# Patient Record
Sex: Male | Born: 1955 | Race: Black or African American | Hispanic: No | Smoking: Never smoker
Health system: Southern US, Community
[De-identification: ages and names within clinical notes are randomized; demographics above are authoritative.]

---

## 2015-04-08 ENCOUNTER — Emergency Department (HOSPITAL_BASED_OUTPATIENT_CLINIC_OR_DEPARTMENT_OTHER): Payer: Self-pay

## 2015-04-08 ENCOUNTER — Emergency Department (HOSPITAL_BASED_OUTPATIENT_CLINIC_OR_DEPARTMENT_OTHER)
Admission: EM | Admit: 2015-04-08 | Discharge: 2015-04-09 | Disposition: A | Discharge status: Home / Self Care | Payer: Self-pay | Attending: Emergency Medicine | Admitting: Emergency Medicine

## 2015-04-08 ENCOUNTER — Encounter (HOSPITAL_BASED_OUTPATIENT_CLINIC_OR_DEPARTMENT_OTHER): Payer: Self-pay | Admitting: *Deleted

## 2015-04-08 DIAGNOSIS — R42 Dizziness and giddiness: Secondary | ICD-10-CM | POA: Insufficient documentation

## 2015-04-08 DIAGNOSIS — R030 Elevated blood-pressure reading, without diagnosis of hypertension: Secondary | ICD-10-CM | POA: Insufficient documentation

## 2015-04-08 DIAGNOSIS — R110 Nausea: Secondary | ICD-10-CM | POA: Insufficient documentation

## 2015-04-08 DIAGNOSIS — R079 Chest pain, unspecified: Secondary | ICD-10-CM | POA: Insufficient documentation

## 2015-04-08 LAB — COMPREHENSIVE METABOLIC PANEL
ALK PHOS: 53 U/L (ref 38–126)
ALT: 26 U/L (ref 17–63)
AST: 25 U/L (ref 15–41)
Albumin: 4.4 g/dL (ref 3.5–5.0)
Anion gap: 7 (ref 5–15)
BUN: 14 mg/dL (ref 6–20)
CALCIUM: 8.8 mg/dL — AB (ref 8.9–10.3)
CHLORIDE: 105 mmol/L (ref 101–111)
CO2: 26 mmol/L (ref 22–32)
Creatinine, Ser: 0.96 mg/dL (ref 0.61–1.24)
GLUCOSE: 113 mg/dL — AB (ref 65–99)
POTASSIUM: 3.4 mmol/L — AB (ref 3.5–5.1)
Sodium: 138 mmol/L (ref 135–145)
Total Bilirubin: 0.4 mg/dL (ref 0.3–1.2)
Total Protein: 7 g/dL (ref 6.5–8.1)

## 2015-04-08 LAB — CBC WITH DIFFERENTIAL/PLATELET
BASOS PCT: 0 % (ref 0–1)
Basophils Absolute: 0 10*3/uL (ref 0.0–0.1)
EOS ABS: 0.1 10*3/uL (ref 0.0–0.7)
Eosinophils Relative: 2 % (ref 0–5)
HCT: 41.4 % (ref 39.0–52.0)
Hemoglobin: 13.9 g/dL (ref 13.0–17.0)
LYMPHS PCT: 34 % (ref 12–46)
Lymphs Abs: 2.5 10*3/uL (ref 0.7–4.0)
MCH: 29.9 pg (ref 26.0–34.0)
MCHC: 33.6 g/dL (ref 30.0–36.0)
MCV: 89 fL (ref 78.0–100.0)
MONO ABS: 0.6 10*3/uL (ref 0.1–1.0)
MONOS PCT: 9 % (ref 3–12)
Neutro Abs: 4 10*3/uL (ref 1.7–7.7)
Neutrophils Relative %: 55 % (ref 43–77)
PLATELETS: 376 10*3/uL (ref 150–400)
RBC: 4.65 MIL/uL (ref 4.22–5.81)
RDW: 13.4 % (ref 11.5–15.5)
WBC: 7.2 10*3/uL (ref 4.0–10.5)

## 2015-04-08 LAB — TROPONIN I

## 2015-04-08 MED ORDER — ASPIRIN 81 MG PO CHEW
324.0000 mg | CHEWABLE_TABLET | Freq: Once | ORAL | Status: AC
  Administered 2015-04-08: 324 mg via ORAL
  Filled 2015-04-08: qty 4

## 2015-04-08 MED ORDER — NITROGLYCERIN 0.4 MG SL SUBL
0.4000 mg | SUBLINGUAL_TABLET | SUBLINGUAL | Status: DC | PRN
  Filled 2015-04-08: qty 1

## 2015-04-08 MED ORDER — GI COCKTAIL ~~LOC~~
30.0000 mL | Freq: Once | ORAL | Status: AC
  Administered 2015-04-08: 30 mL via ORAL
  Filled 2015-04-08: qty 30

## 2015-04-08 NOTE — ED Notes (Signed)
Pt sts he began having a feeling like something is moving around in his chest x2 hours. Pt sts he thought is was gas but it is persisting. Denies any other symptoms.

## 2015-04-08 NOTE — ED Provider Notes (Signed)
CSN: [id number]     Arrival date & time 04/08/15  2112 History  This chart was scribed for Victor Fossa, MD by Lyndel Safe, ED Scribe. This patient was seen in room MH05/MH05 and the patient's care was started 9:29 PM.   Chief Complaint  Patient presents with  . Chest Pain   The history is provided by the patient. No language interpreter was used.    HPI Comments: Victor Bradley is a 59 y.o. male, with no pertinent PMhx, who presents to the Emergency Department complaining of waxing and waning, 3/10 chest pain onset 3 hours ago. He describes the pain to feel like 'gas moving.' Pt reports he was sitting on his front porch when the pain came on. He associates nausea and dizziness. He states he has had a similar pain in the past which he was evaluated for and diagnosed with gas. He reports he consumes liquor and beer daily, having consumed 1 beer pta. Pt is not followed by a PCP and denies a history of cardiac catheterization or stress test. Additionally denies use of smoking tobacco, taking Cialis, or illegal drugs (specifically cocaine) within 24 hours, abdominal pain, diaphoresis, LE edema, recent injuries, or any PMhx.   History reviewed. No pertinent past medical history. History reviewed. No pertinent past surgical history. No family history on file. History  Substance Use Topics  . Smoking status: Never Smoker   . Smokeless tobacco: Not on file  . Alcohol Use: Yes    Review of Systems  Constitutional: Negative for diaphoresis.  Cardiovascular: Positive for chest pain. Negative for leg swelling.  Gastrointestinal: Positive for nausea. Negative for abdominal pain.  Neurological: Positive for dizziness.   Allergies  Review of patient's allergies indicates no known allergies.  Home Medications   Prior to Admission medications   Medication Sig Start Date End Date Taking? Authorizing Provider  Multiple Vitamin (MULTIVITAMIN) tablet Take 1 tablet by mouth daily.   Yes Historical  Provider, MD   BP 177/77 mmHg  Pulse 108  Temp(Src) 98.8 F (37.1 C) (Oral)  Resp 16  Ht  (1.803 m)  Wt 178 lb (80.74 kg)  BMI 24.84 kg/m2  SpO2 97% Physical Exam  Constitutional: He is oriented to person, place, and time. He appears well-developed and well-nourished.  HENT:  Head: Normocephalic and atraumatic.  Cardiovascular: Normal rate and regular rhythm.   No murmur heard. Pulmonary/Chest: Effort normal and breath sounds normal. No respiratory distress.  Abdominal: Soft. There is no tenderness. There is no rebound and no guarding.  Musculoskeletal: He exhibits no edema or tenderness.  Neurological: He is alert and oriented to person, place, and time.  Skin: Skin is warm and dry.  Psychiatric: He has a normal mood and affect. His behavior is normal.  Nursing note and vitals reviewed.   ED Course  Procedures  DIAGNOSTIC STUDIES: Oxygen Saturation is 97% on RA, normal by my interpretation.    COORDINATION OF CARE: 9:35 PM Discussed treatment plan which includes to order aspirin and nitroglycerine with pt. Discussed EKG results with pt after repeat EKG. Will also order chest Xray and diagnostic labs. Pt acknowledges and agrees to plan.   Labs Review Labs Reviewed  CBC WITH DIFFERENTIAL/PLATELET  TROPONIN I  COMPREHENSIVE METABOLIC PANEL    Imaging Review Dg Chest Port 1 View  04/08/2015   CLINICAL DATA:  Chest pain for 1 day  EXAM: PORTABLE CHEST - 1 VIEW  COMPARISON:  None.  FINDINGS: There is no edema or consolidation. The  heart size and pulmonary vascularity are normal. No adenopathy. No pneumothorax. No bone lesions.  IMPRESSION: No edema or consolidation.   Electronically Signed   By: Bretta Bang III M.D.   On: 04/08/2015 21:58     EKG Interpretation   Date/Time:  Wednesday April 08 2015 21:33:21 EDT Ventricular Rate:  102 PR Interval:  172 QRS Duration: 114 QT Interval:  364 QTC Calculation: 474 R Axis:   41 Text Interpretation:  Sinus  tachycardia Nonspecific T wave abnormality  Abnormal ECG Confirmed by Lincoln Brigham [phone]) on 04/08/2015 9:46:16 PM      MDM   Final diagnoses:  Chest pain, unspecified chest pain type  Elevated blood pressure reading   Pt here for chest discomfort, he describes as "gas."  Initial EKG with significant artifact, repeat with nonspecific changes.  Pain resolved in department after GI cocktail.  Hx and presentation not c/w ACS, PE, dissection, pancreatitis.  Discussed with patient possible reflux, elevated blood pressures and need for PCP as well as Cardiology follow up.  Return precautions were discussed.     I personally performed the services described in this documentation, which was scribed in my presence. The recorded information has been reviewed and is accurate.    Victor Fossa, MD 04/09/15 1459

## 2015-04-08 NOTE — ED Notes (Addendum)
Victor Bradley reprts complete relief after GI cocktail denies pain or chest discomfort at this time after several burps and several passagings of flatus.

## 2015-04-09 LAB — TROPONIN I: Troponin I: <0.03 ng/mL (ref <0.031)

## 2015-04-09 MED ORDER — ASPIRIN EC 325 MG PO TBEC: 325.0000 mg | DELAYED_RELEASE_TABLET | Freq: Every day | ORAL | Status: AC

## 2015-04-09 MED ORDER — OMEPRAZOLE 20 MG PO CPDR: 20.0000 mg | DELAYED_RELEASE_CAPSULE | Freq: Every day | ORAL | Status: AC

## 2015-04-09 NOTE — Discharge Instructions (Signed)
Chest Pain (Nonspecific) °It is often hard to give a specific diagnosis for the cause of chest pain. There is always a chance that your pain could be related to something serious, such as a heart attack or a blood clot in the lungs. You need to follow up with your health care provider for further evaluation. °CAUSES  °· Heartburn. °· Pneumonia or bronchitis. °· Anxiety or stress. °· Inflammation around your heart (pericarditis) or lung (pleuritis or pleurisy). °· A blood clot in the lung. °· A collapsed lung (pneumothorax). It can develop suddenly on its own (spontaneous pneumothorax) or from trauma to the chest. °· Shingles infection (herpes zoster virus). °The chest wall is composed of bones, muscles, and cartilage. Any of these can be the source of the pain. °· The bones can be bruised by injury. °· The muscles or cartilage can be strained by coughing or overwork. °· The cartilage can be affected by inflammation and become sore (costochondritis). °DIAGNOSIS  °Lab tests or other studies may be needed to find the cause of your pain. Your health care provider may have you take a test called an ambulatory electrocardiogram (ECG). An ECG records your heartbeat patterns over a 24-hour period. You may also have other tests, such as: °· Transthoracic echocardiogram (TTE). During echocardiography, sound waves are used to evaluate how blood flows through your heart. °· Transesophageal echocardiogram (TEE). °· Cardiac monitoring. This allows your health care provider to monitor your heart rate and rhythm in real time. °· Holter monitor. This is a portable device that records your heartbeat and can help diagnose heart arrhythmias. It allows your health care provider to track your heart activity for several days, if needed. °· Stress tests by exercise or by giving medicine that makes the heart beat faster. °TREATMENT  °· Treatment depends on what may be causing your chest pain. Treatment may include: °· Acid blockers for  heartburn. °· Anti-inflammatory medicine. °· Pain medicine for inflammatory conditions. °· Antibiotics if an infection is present. °· You may be advised to change lifestyle habits. This includes stopping smoking and avoiding alcohol, caffeine, and chocolate. °· You may be advised to keep your head raised (elevated) when sleeping. This reduces the chance of acid going backward from your stomach into your esophagus. °Most of the time, nonspecific chest pain will improve within 2-3 days with rest and mild pain medicine.  °HOME CARE INSTRUCTIONS  °· If antibiotics were prescribed, take them as directed. Finish them even if you start to feel better. °· For the next few days, avoid physical activities that bring on chest pain. Continue physical activities as directed. °· Do not use any tobacco products, including cigarettes, chewing tobacco, or electronic cigarettes. °· Avoid drinking alcohol. °· Only take medicine as directed by your health care provider. °· Follow your health care provider's suggestions for further testing if your chest pain does not go away. °· Keep any follow-up appointments you made. If you do not go to an appointment, you could develop lasting (chronic) problems with pain. If there is any problem keeping an appointment, call to reschedule. °SEEK MEDICAL CARE IF:  °· Your chest pain does not go away, even after treatment. °· You have a rash with blisters on your chest. °· You have a fever. °SEEK IMMEDIATE MEDICAL CARE IF:  °· You have increased chest pain or pain that spreads to your arm, neck, jaw, back, or abdomen. °· You have shortness of breath. °· You have an increasing cough, or you cough   up blood. °· You have severe back or abdominal pain. °· You feel nauseous or vomit. °· You have severe weakness. °· You faint. °· You have chills. °This is an emergency. Do not wait to see if the pain will go away. Get medical help at once. Call your local emergency services (911 in [place] Do not drive  yourself to the hospital. °MAKE SURE YOU:  °· Understand these instructions. °· Will watch your condition. °· Will get help right away if you are not doing well or get worse. °Document Released: 06/08/2005 Document Revised: 09/03/2013 Document Reviewed: 04/03/2008 °ExitCare® Patient Information ©2015 [employer]. This information is not intended to replace advice given to you by your health care provider. Make sure you discuss any questions you have with your health care provider. °Gastroesophageal Reflux Disease, Adult °Gastroesophageal reflux disease (GERD) happens when acid from your stomach flows up into the esophagus. When acid comes in contact with the esophagus, the acid causes soreness (inflammation) in the esophagus. Over time, GERD may create small holes (ulcers) in the lining of the esophagus. °CAUSES  °· Increased body weight. This puts pressure on the stomach, making acid rise from the stomach into the esophagus. °· Smoking. This increases acid production in the stomach. °· Drinking alcohol. This causes decreased pressure in the lower esophageal sphincter (valve or ring of muscle between the esophagus and stomach), allowing acid from the stomach into the esophagus. °· Late evening meals and a full stomach. This increases pressure and acid production in the stomach. °· A malformed lower esophageal sphincter. °Sometimes, no cause is found. °SYMPTOMS  °· Burning pain in the lower part of the mid-chest behind the breastbone and in the mid-stomach area. This may occur twice a week or more often. °· Trouble swallowing. °· Sore throat. °· Dry cough. °· Asthma-like symptoms including chest tightness, shortness of breath, or wheezing. °DIAGNOSIS  °Your caregiver may be able to diagnose GERD based on your symptoms. In some cases, X-rays and other tests may be done to check for complications or to check the condition of your stomach and esophagus. °TREATMENT  °Your caregiver may recommend over-the-counter or  prescription medicines to help decrease acid production. Ask your caregiver before starting or adding any new medicines.  °HOME CARE INSTRUCTIONS  °· Change the factors that you can control. Ask your caregiver for guidance concerning weight loss, quitting smoking, and alcohol consumption. °· Avoid foods and drinks that make your symptoms worse, such as: °¨ Caffeine or alcoholic drinks. °¨ Chocolate. °¨ Peppermint or mint flavorings. °¨ Garlic and onions. °¨ Spicy foods. °¨ Citrus fruits, such as oranges, lemons, or limes. °¨ Tomato-based foods such as sauce, chili, salsa, and pizza. °¨ Fried and fatty foods. °· Avoid lying down for the 3 hours prior to your bedtime or prior to taking a nap. °· Eat small, frequent meals instead of large meals. °· Wear loose-fitting clothing. Do not wear anything tight around your waist that causes pressure on your stomach. °· Raise the head of your bed 6 to 8 inches with wood blocks to help you sleep. Extra pillows will not help. °· Only take over-the-counter or prescription medicines for pain, discomfort, or fever as directed by your caregiver. °· Do not take aspirin, ibuprofen, or other nonsteroidal anti-inflammatory drugs (NSAIDs). °SEEK IMMEDIATE MEDICAL CARE IF:  °· You have pain in your arms, neck, jaw, teeth, or back. °· Your pain increases or changes in intensity or duration. °· You develop nausea, vomiting, or sweating (diaphoresis). °·   You develop shortness of breath, or you faint. °· Your vomit is green, yellow, black, or looks like coffee grounds or blood. °· Your stool is red, bloody, or black. °These symptoms could be signs of other problems, such as heart disease, gastric bleeding, or esophageal bleeding. °MAKE SURE YOU:  °· Understand these instructions. °· Will watch your condition. °· Will get help right away if you are not doing well or get worse. °Document Released: 06/08/2005 Document Revised: 11/21/2011 Document Reviewed: 03/18/2011 °ExitCare® Patient  Information ©2015 [employer]. This information is not intended to replace advice given to you by your health care provider. Make sure you discuss any questions you have with your health care provider. ° °

## 2018-11-08 ENCOUNTER — Encounter (HOSPITAL_BASED_OUTPATIENT_CLINIC_OR_DEPARTMENT_OTHER): Payer: Self-pay

## 2018-11-08 ENCOUNTER — Other Ambulatory Visit: Payer: Self-pay

## 2018-11-08 ENCOUNTER — Emergency Department (HOSPITAL_BASED_OUTPATIENT_CLINIC_OR_DEPARTMENT_OTHER)
Admission: EM | Admit: 2018-11-08 | Discharge: 2018-11-09 | Disposition: A | Discharge status: Home / Self Care | Payer: Self-pay | Attending: Emergency Medicine | Admitting: Emergency Medicine

## 2018-11-08 ENCOUNTER — Emergency Department (HOSPITAL_BASED_OUTPATIENT_CLINIC_OR_DEPARTMENT_OTHER): Payer: Self-pay

## 2018-11-08 DIAGNOSIS — N210 Calculus in bladder: Secondary | ICD-10-CM | POA: Insufficient documentation

## 2018-11-08 DIAGNOSIS — R310 Gross hematuria: Secondary | ICD-10-CM | POA: Insufficient documentation

## 2018-11-08 DIAGNOSIS — Z79899 Other long term (current) drug therapy: Secondary | ICD-10-CM | POA: Insufficient documentation

## 2018-11-08 LAB — URINALYSIS, MICROSCOPIC (REFLEX): RBC / HPF: >50 RBC/hpf (ref 0–5)

## 2018-11-08 LAB — URINALYSIS, ROUTINE W REFLEX MICROSCOPIC

## 2018-11-08 NOTE — ED Triage Notes (Signed)
C/o intermittent blood in urine x 1 month-worse today-NAD-steady gait

## 2018-11-08 NOTE — ED Provider Notes (Signed)
[facility] Provider Note: Victor Dell, MD, FACEP  CSN: [id number] MRN: [id number] ARRIVAL: 11/08/18 at 2144 ROOM: MH05/MH05   CHIEF COMPLAINT  Hematuria   HISTORY OF PRESENT ILLNESS  11/08/18 11:07 PM Victor Bradley is a 63 y.o. male remittent hematuria for the past 2 months.  His hematuria typically would last for several days and would not be severe.  He is here with 1 day of severe hematuria.  There is no associated pain or other symptoms.  He has no significant past medical history.   History reviewed. No pertinent past medical history.  History reviewed. No pertinent surgical history.  No family history on file.  Social History   Tobacco Use  . Smoking status: Never Smoker  . Smokeless tobacco: Never Used  Substance Use Topics  . Alcohol use: Yes    Comment: occ  . Drug use: No    Prior to Admission medications   Medication Sig Start Date End Date Taking? Authorizing Provider  aspirin EC 325 MG tablet Take 1 tablet (325 mg total) by mouth daily. 04/09/15   [name], MD  Multiple Vitamin (MULTIVITAMIN) tablet Take 1 tablet by mouth daily.    [provider]  omeprazole (PRILOSEC) 20 MG capsule Take 1 capsule (20 mg total) by mouth daily. 04/09/15   [name], MD    Allergies Patient has no known allergies.   REVIEW OF SYSTEMS  Negative except as noted here or in the History of Present Illness.   PHYSICAL EXAMINATION  Initial Vital Signs Blood pressure (!) 186/122, pulse (!) 104, temperature 97.7 F (36.5 C), temperature source Oral, resp. rate 20, height 6' (1.829 m), weight 80.5 kg, SpO2 98 %.  Examination General: Well-developed, well-nourished male in no acute distress; appearance consistent with age of record HENT: normocephalic; atraumatic Eyes: Normal appearance Neck: supple Heart: regular rate and rhythm Lungs: clear to auscultation bilaterally Abdomen: soft; nondistended; nontender; no masses or  hepatosplenomegaly; bowel sounds present GU: No CVA tenderness Extremities: No deformity; full range of motion Neurologic: Awake, alert and oriented; motor function intact in all extremities and symmetric; no facial droop Skin: Warm and dry Psychiatric: Normal mood and affect   RESULTS  Summary of this visit's results, reviewed by myself:   EKG Interpretation  Date/Time:    Ventricular Rate:    PR Interval:    QRS Duration:   QT Interval:    QTC Calculation:   R Axis:     Text Interpretation:        Laboratory Studies: Results for orders placed or performed during the hospital encounter of 11/08/18 (from the past 24 hour(s))  Urinalysis, Routine w reflex microscopic     Status: Abnormal   Collection Time: 11/08/18 10:06 PM  Result Value Ref Range   Color, Urine RED (A) YELLOW   APPearance TURBID (A) CLEAR   Specific Gravity, Urine  1.005 - 1.030    TEST NOT REPORTED DUE TO COLOR INTERFERENCE OF URINE PIGMENT   pH  5.0 - 8.0    TEST NOT REPORTED DUE TO COLOR INTERFERENCE OF URINE PIGMENT   Glucose, UA (A) NEGATIVE mg/dL    TEST NOT REPORTED DUE TO COLOR INTERFERENCE OF URINE PIGMENT   Hgb urine dipstick (A) NEGATIVE    TEST NOT REPORTED DUE TO COLOR INTERFERENCE OF URINE PIGMENT   Bilirubin Urine (A) NEGATIVE    TEST NOT REPORTED DUE TO COLOR INTERFERENCE OF URINE PIGMENT   Ketones, ur (A) NEGATIVE mg/dL  TEST NOT REPORTED DUE TO COLOR INTERFERENCE OF URINE PIGMENT   Protein, ur (A) NEGATIVE mg/dL    TEST NOT REPORTED DUE TO COLOR INTERFERENCE OF URINE PIGMENT   Nitrite (A) NEGATIVE    TEST NOT REPORTED DUE TO COLOR INTERFERENCE OF URINE PIGMENT   Leukocytes,Ua (A) NEGATIVE    TEST NOT REPORTED DUE TO COLOR INTERFERENCE OF URINE PIGMENT  Urinalysis, Microscopic (reflex)     Status: Abnormal   Collection Time: 11/08/18 10:06 PM  Result Value Ref Range   RBC / HPF >50 0 - 5 RBC/hpf   WBC, UA 0-5 0 - 5 WBC/hpf   Bacteria, UA RARE (A) NONE SEEN   Squamous  Epithelial / LPF 6-10 0 - 5   Imaging Studies: Ct Renal Stone Study  Result Date: 11/08/2018 CLINICAL DATA:  Intermittent blood in the urine for 1 month. Worse today. EXAM: CT ABDOMEN AND PELVIS WITHOUT CONTRAST TECHNIQUE: Multidetector CT imaging of the abdomen and pelvis was performed following the standard protocol without IV contrast. COMPARISON:  None. FINDINGS: Lower chest: Lung bases are clear. Hepatobiliary: No focal liver abnormality is seen. No gallstones, gallbladder wall thickening, or biliary dilatation. Pancreas: Unremarkable. No pancreatic ductal dilatation or surrounding inflammatory changes. Spleen: Normal in size without focal abnormality. 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 No hydronephrosis or hydroureter. The bladder is moderately distended with diffusely thickened wall. This could represent cystitis or hypertrophy due to outlet obstruction. Large calcifications in the bladder measuring up to 1.2 x 3.1 cm diameter consistent with bladder stones. In addition, there is surrounding lobular soft tissue density. This could represent a soft tissue mass in the bladder or adherent hemorrhage. Consider cystoscopy for further evaluation. Stomach/Bowel: Stomach, small bowel, and colon are mostly decompressed. No wall thickening or inflammatory changes are appreciated. Appendix is normal. Vascular/Lymphatic: Aortic atherosclerosis. No enlarged abdominal or pelvic lymph nodes. Reproductive: Prostate gland is markedly enlarged, measuring 6.8 cm in maximal diameter. Other: No free air or free fluid in the abdomen. Abdominal wall musculature appears intact. Musculoskeletal: Degenerative changes in the lumbar spine. No destructive bone lesions. IMPRESSION: 1. 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 No hydronephrosis or hydroureter. 2. Bladder is moderately distended with diffusely thickened wall. Large bladder stones. Irregular soft tissue density in the bladder could represent a soft tissue mass or adherent hemorrhage. Consider cystoscopy for further evaluation. 3. Marked enlargement of the prostate gland. Aortic Atherosclerosis (ICD10-I70.0). Electronically Signed   By: Burman Nieves M.D.   On: 11/08/2018 23:33    ED COURSE and MDM  Nursing notes and initial vitals signs, including pulse oximetry, reviewed.  Vitals:   11/08/18 2151  BP: (!) 186/122  Pulse: (!) 104  Resp: 20  Temp: 97.7 F (36.5 C)  TempSrc: Oral  SpO2: 98%  Weight: 80.5 kg  Height: 6' (1.829 m)   Patient informed of CT findings.  I suspect the hematuria is due to the bladder stones.  He was advised that cancer cannot be excluded at this time and he will need definitive urologic follow-up.  PROCEDURES    ED DIAGNOSES     ICD-10-CM   1. Bladder stones N21.0   2. Gross hematuria R31.0        Robben Jagiello, Jonny Ruiz, MD 11/08/18 [phone]

## 2020-06-22 IMAGING — CT CT RENAL STONE PROTOCOL
2 of 4 series · 15 of 46 positions shown, 17 images · non-contrast
Comparison: None.

CLINICAL DATA: Intermittent blood in the urine for 1 month. Worse
today.

EXAM:
CT ABDOMEN AND PELVIS WITHOUT CONTRAST
TECHNIQUE: Multidetector CT imaging of the abdomen and pelvis was performed
following the standard protocol without IV contrast.

[Series 2: axial st · axial · 0.68mm/px · z∈[-450,-20]mm · 12 of 94 slices shown, 14 images]
[im 4/94  soft-tissue]
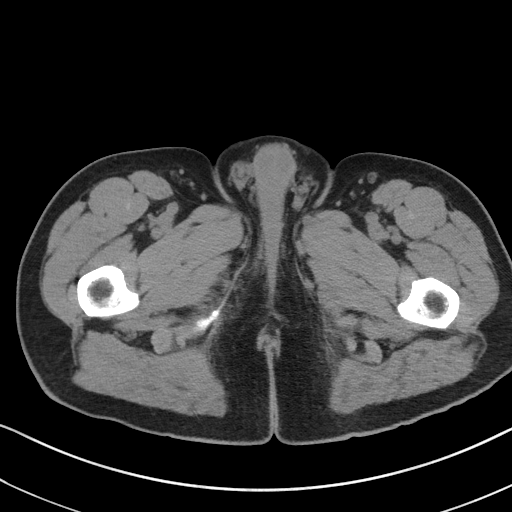
[im 4/94  bone]
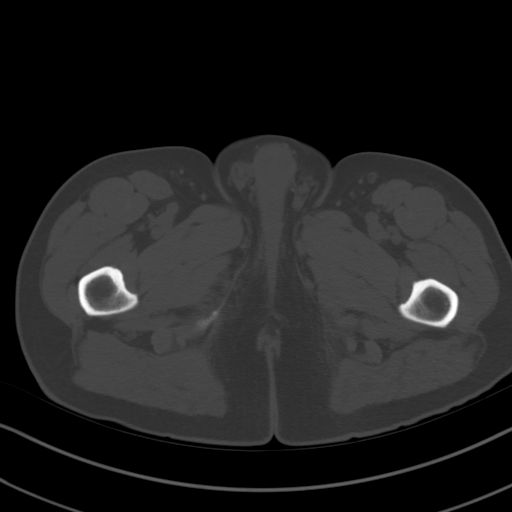
[im 12/94  soft-tissue]
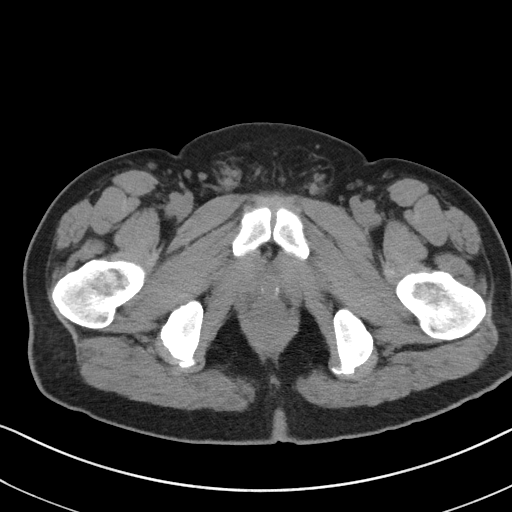
[im 20/94  soft-tissue]
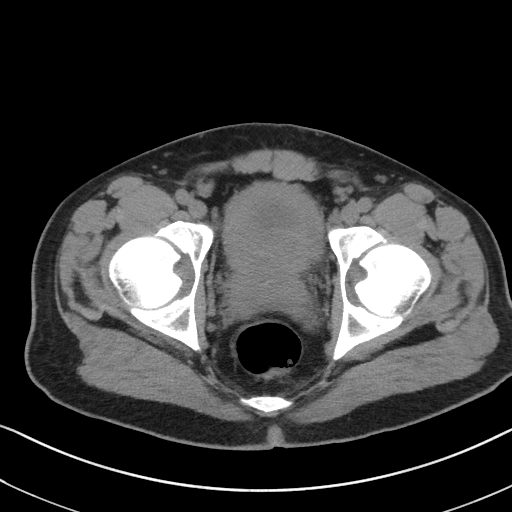
[im 28/94  soft-tissue]
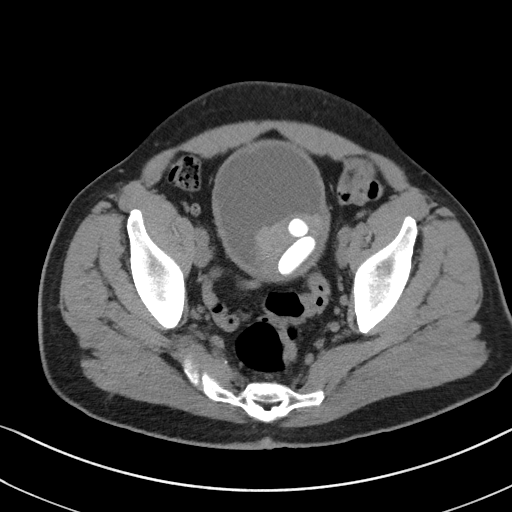
[im 35/94  soft-tissue]
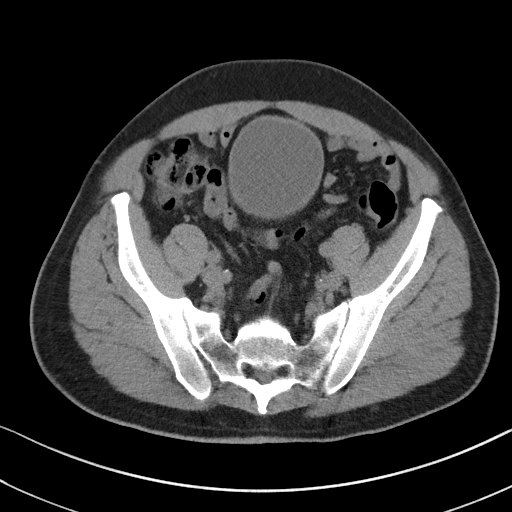
[im 43/94  soft-tissue]
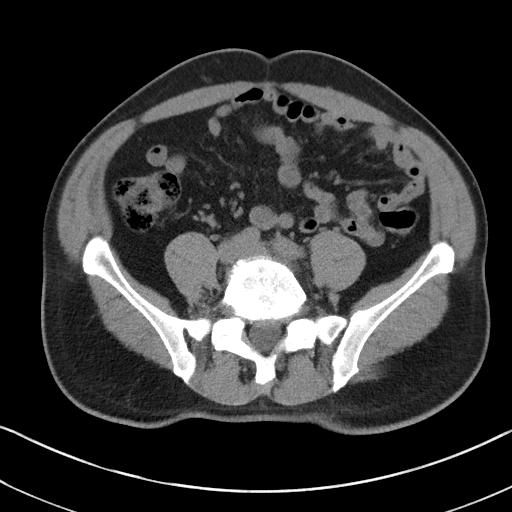
[im 51/94  soft-tissue]
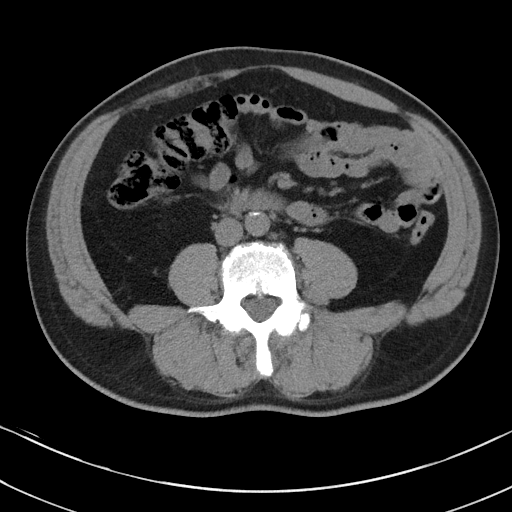
[im 59/94  soft-tissue]
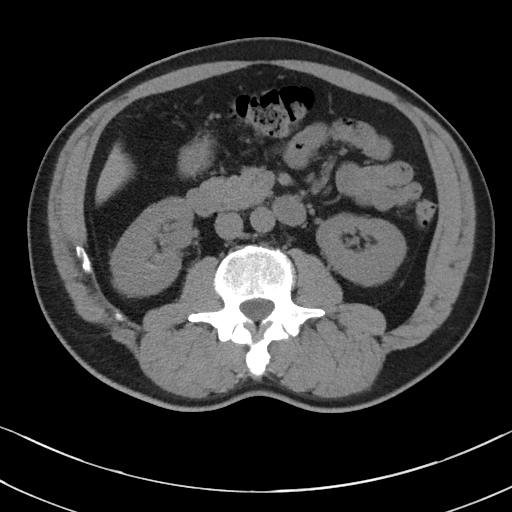
[im 66/94  soft-tissue]
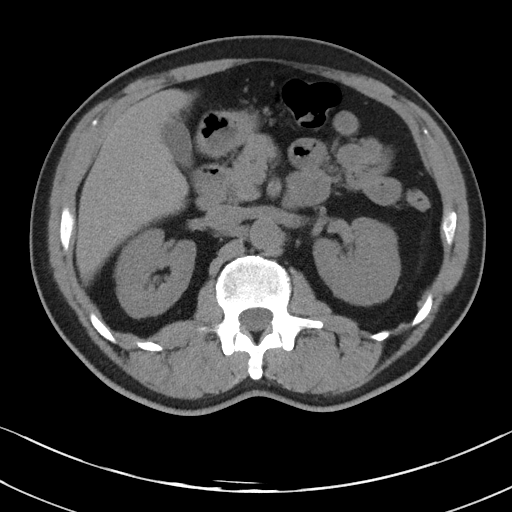
[im 66/94  bone]
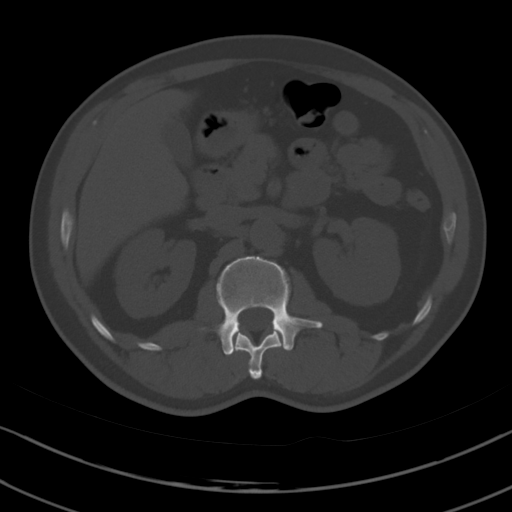
[im 74/94  soft-tissue]
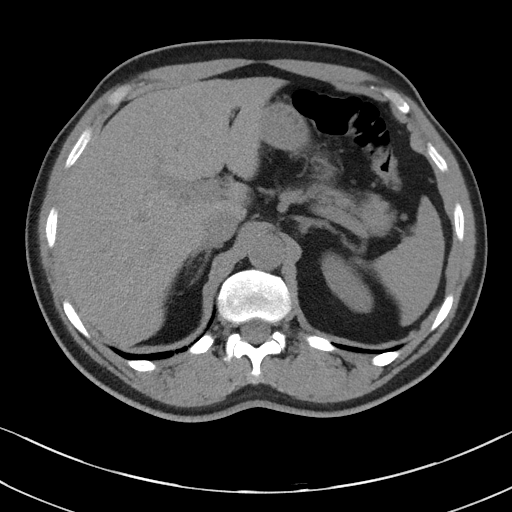
[im 82/94  soft-tissue]
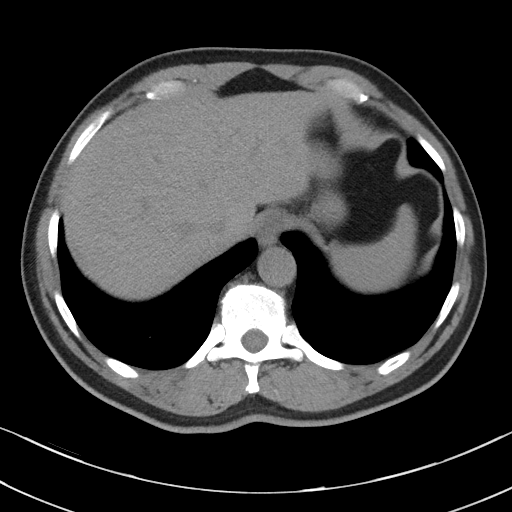
[im 90/94  soft-tissue]
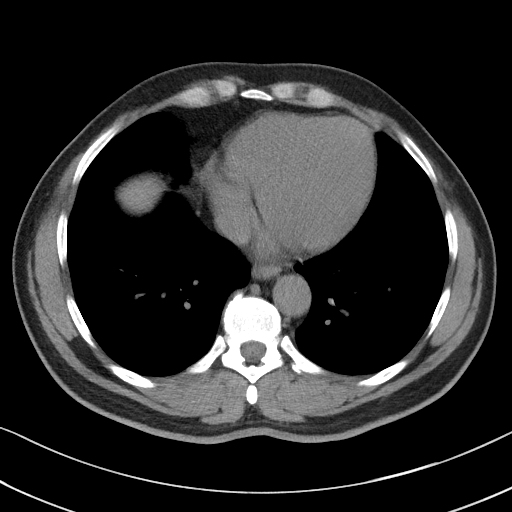

[Series 5: coronal st · coronal · 0.75mm/px · 3 of 101 slices shown]
[im 34/101  soft-tissue]
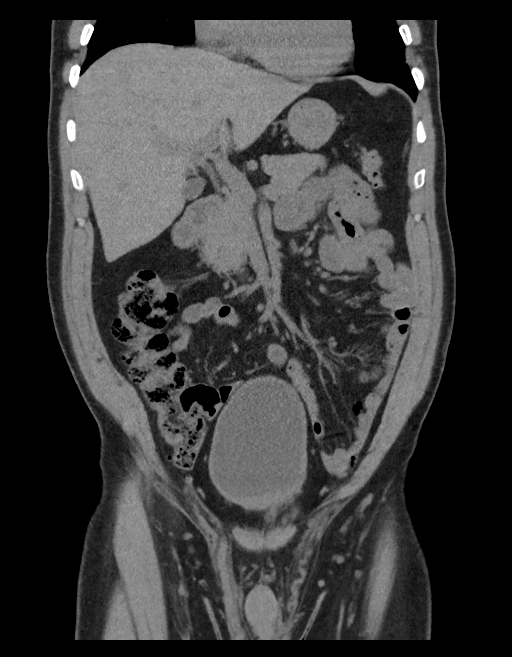
[im 45/101  soft-tissue]
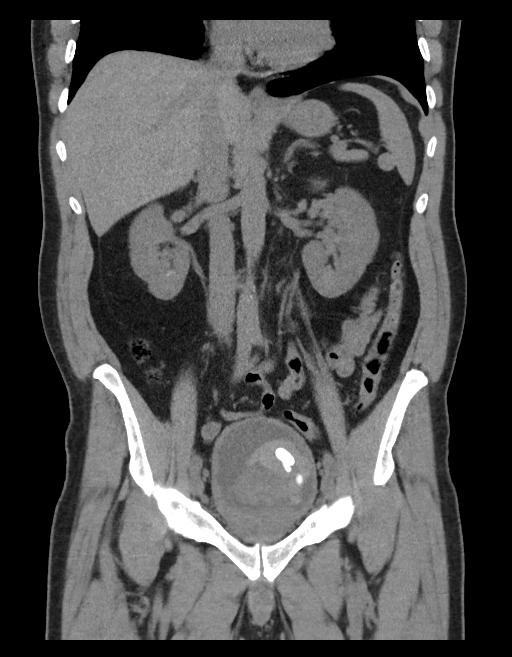
[im 56/101  soft-tissue]
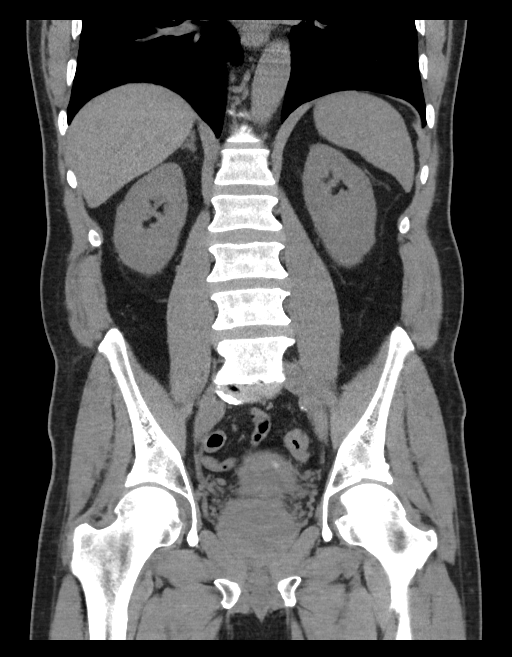

[15 of 46 positions shown; findings below may reference images not displayed]

FINDINGS: Lower chest: Lung bases are clear.

Hepatobiliary: No focal liver abnormality is seen. No gallstones,
gallbladder wall thickening, or biliary dilatation.

Pancreas: Unremarkable. No pancreatic ductal dilatation or
surrounding inflammatory changes.

Spleen: Normal in size without focal abnormality.

Adrenals/Urinary Tract: Calcifications and small stones demonstrated
in the mid and lower pole of the right kidney with medullary
location suggesting medullary calcinosis, possibly medullary sponge
kidney. No hydronephrosis or hydroureter. The bladder is moderately
distended with diffusely thickened wall. This could represent
cystitis or hypertrophy due to outlet obstruction. Large
calcifications in the bladder measuring up to 1.2 x 3.1 cm diameter
consistent with bladder stones. In addition, there is surrounding
lobular soft tissue density. This could represent a soft tissue mass
in the bladder or adherent hemorrhage. Consider cystoscopy for
further evaluation.

Stomach/Bowel: Stomach, small bowel, and colon are mostly
decompressed. No wall thickening or inflammatory changes are
appreciated. Appendix is normal.

Vascular/Lymphatic: Aortic atherosclerosis. No enlarged abdominal or
pelvic lymph nodes.

Reproductive: Prostate gland is markedly enlarged, measuring 6.8 cm
in maximal diameter.

Other: No free air or free fluid in the abdomen. Abdominal wall
musculature appears intact.

Musculoskeletal: Degenerative changes in the lumbar spine. No
destructive bone lesions.
IMPRESSION: 1. Calcifications and small stones in the right kidney with
medullary location suggesting medullary calcinosis, possibly
medullary sponge kidney. No hydronephrosis or hydroureter.
2. Bladder is moderately distended with diffusely thickened wall.
Large bladder stones. Irregular soft tissue density in the bladder
could represent a soft tissue mass or adherent hemorrhage. Consider
cystoscopy for further evaluation.
3. Marked enlargement of the prostate gland.

Aortic Atherosclerosis ([id number]-XGE.E).
